# Patient Record
Sex: Male | Born: 1980 | Hispanic: Yes | Marital: Single | Smoking: Never smoker
Health system: Southern US, Community
[De-identification: ages and names within clinical notes are randomized; demographics above are authoritative.]

---

## 2018-07-27 ENCOUNTER — Emergency Department (HOSPITAL_COMMUNITY)
Admission: EM | Admit: 2018-07-27 | Discharge: 2018-07-28 | Disposition: A | Discharge status: Home / Self Care | Payer: Self-pay | Attending: Emergency Medicine | Admitting: Emergency Medicine

## 2018-07-27 ENCOUNTER — Other Ambulatory Visit: Payer: Self-pay

## 2018-07-27 ENCOUNTER — Encounter (HOSPITAL_COMMUNITY): Payer: Self-pay | Admitting: Emergency Medicine

## 2018-07-27 ENCOUNTER — Emergency Department (HOSPITAL_COMMUNITY): Payer: Self-pay

## 2018-07-27 DIAGNOSIS — S61011A Laceration without foreign body of right thumb without damage to nail, initial encounter: Secondary | ICD-10-CM | POA: Insufficient documentation

## 2018-07-27 DIAGNOSIS — Y9389 Activity, other specified: Secondary | ICD-10-CM | POA: Insufficient documentation

## 2018-07-27 DIAGNOSIS — Y990 Civilian activity done for income or pay: Secondary | ICD-10-CM | POA: Insufficient documentation

## 2018-07-27 DIAGNOSIS — Y929 Unspecified place or not applicable: Secondary | ICD-10-CM | POA: Insufficient documentation

## 2018-07-27 DIAGNOSIS — Z23 Encounter for immunization: Secondary | ICD-10-CM | POA: Insufficient documentation

## 2018-07-27 DIAGNOSIS — W260XXA Contact with knife, initial encounter: Secondary | ICD-10-CM | POA: Insufficient documentation

## 2018-07-27 MED ORDER — TETANUS-DIPHTH-ACELL PERTUSSIS 5-2.5-18.5 LF-MCG/0.5 IM SUSP
0.5000 mL | Freq: Once | INTRAMUSCULAR | Status: AC
  Administered 2018-07-27: 0.5 mL via INTRAMUSCULAR

## 2018-07-27 MED ORDER — TETANUS-DIPHTH-ACELL PERTUSSIS 5-2.5-18.5 LF-MCG/0.5 IM SUSP
INTRAMUSCULAR | Status: AC
  Administered 2018-07-27: 0.5 mL via INTRAMUSCULAR
  Filled 2018-07-27: qty 0.5

## 2018-07-27 NOTE — ED Triage Notes (Signed)
Pt states he cut right thumb with carpet blade at 11am today.

## 2018-07-27 NOTE — ED Notes (Signed)
Non stick telfa applied.

## 2018-07-28 NOTE — ED Provider Notes (Addendum)
[facility] EMERGENCY DEPARTMENT Provider Note   CSN: [id number] Arrival date & time: 07/27/18  2247  Time seen 11:42 PM   History   Chief Complaint Chief Complaint  Patient presents with  . Laceration    HPI Robert Malone is a 38 y.o. male.  HPI   patient is employed Physiological scientist and flooring.  He is right-handed.  Today he was using his left hand and he accidentally cut his right palm with a carpet blade.  He states it has been persistently bleeding which prompted his ED visit tonight.  He states his last tetanus was when he was around 38 years old.  PCP Patient, No Pcp Per  History reviewed. No pertinent past medical history.  There are no active problems to display for this patient.   History reviewed. No pertinent surgical history.      Home Medications    Prior to Admission medications   Not on File    Family History No family history on file.  Social History Social History   Tobacco Use  . Smoking status: Never Smoker  . Smokeless tobacco: Never Used  Substance Use Topics  . Alcohol use: Yes  . Drug use: Never   employed  Allergies   Patient has no allergy information on record.   Review of Systems Review of Systems  All other systems reviewed and are negative.    Physical Exam Updated Vital Signs BP (!) 189/84 (BP Location: Right Arm)   Pulse 61   Temp 98.1 F (36.7 C) (Oral)   Resp 17   Ht 5\' 1"  (1.549 m)   Wt 69.4 kg   SpO2 100%   BMI 28.91 kg/m   Physical Exam Vitals signs and nursing note reviewed.  Constitutional:      Appearance: Normal appearance.  HENT:     Head: Normocephalic and atraumatic.     Nose: Nose normal.  Eyes:     Extraocular Movements: Extraocular movements intact.     Conjunctiva/sclera: Conjunctivae normal.  Neck:     Musculoskeletal: Normal range of motion.  Cardiovascular:     Rate and Rhythm: Normal rate.  Pulmonary:     Effort: Pulmonary effort is normal. No respiratory distress.    Musculoskeletal: Normal range of motion.        General: Signs of injury present.     Comments: Patient has a linear 1.3 cm a avulsion of the end of his right thumb on the volar aspect.  Currently it is not bleeding but the dressing had dried where it had been bleeding.  Skin:    General: Skin is warm and dry.     Capillary Refill: Capillary refill takes less than 2 seconds.  Neurological:     General: No focal deficit present.     Mental Status: He is alert and oriented to person, place, and time.  Psychiatric:        Mood and Affect: Mood normal.        Behavior: Behavior normal.        Thought Content: Thought content normal.        ED Treatments / Results  Labs (all labs ordered are listed, but only abnormal results are displayed) Labs Reviewed - No data to display  EKG None  Radiology Dg Finger Thumb Right  Result Date: 07/28/2018 CLINICAL DATA:  Laceration EXAM: RIGHT THUMB 2+V COMPARISON:  None. FINDINGS: There is no evidence of fracture or dislocation. There is no evidence of arthropathy or other  focal bone abnormality. Laceration to the tip of the thumb. No radiopaque foreign body IMPRESSION: No acute osseous abnormality Electronically Signed   By: Jasmine PangKim  Fujinaga M.D.   On: 07/28/2018 00:31     Procedures Procedures (including critical care time)  Medications Ordered in ED Medications  Tdap (BOOSTRIX) injection 0.5 mL (0.5 mLs Intramuscular Given 07/27/18 2357)     Initial Impression / Assessment and Plan / ED Course  I have reviewed the triage vital signs and the nursing notes.  Pertinent labs & imaging results that were available during my care of the patient were reviewed by me and considered in my medical decision making (see chart for details).    Surgicel drip was placed on the avulses laceration with a non-sticky dressing and then wrapped.  We discussed his aftercare.  His tetanus was updated with a Boostrix.  Final Clinical Impressions(s) / ED  Diagnoses   Final diagnoses:  Laceration of right thumb without foreign body without damage to nail, initial encounter    ED Discharge Orders    None     Plan discharge  Devoria AlbeIva Velita Quirk, MD, Concha PyoFACEP    Shayona Hibbitts, MD 07/28/18 Lance Coon0011    Devoria AlbeKnapp, Keyry Iracheta, MD 07/28/18 [phone]

## 2018-07-28 NOTE — Discharge Instructions (Addendum)
Keep the dressing in place for the next 4 to 5 days and then remove it, it should slowly heal.  Return to the ED for any signs of infection.  Take Tylenol 650 mg every 6 hours if needed for discomfort.   Mantenga el vendaje en su lugar durante los prximos 4 a 5 das y luego qutelo, Energy managerdebe sanar lentamente. Regrese al servicio de urgencias para detectar cualquier signo de infeccin. Tome Tylenol 650 mg cada 6 horas si es necesario por molestias.

## 2020-03-25 IMAGING — DX DG FINGER THUMB 2+V*R*
3 series · 3 of 3 positions shown · non-contrast
Comparison: None.

CLINICAL DATA: Laceration

EXAM:
RIGHT THUMB 2+V

[finger ap]
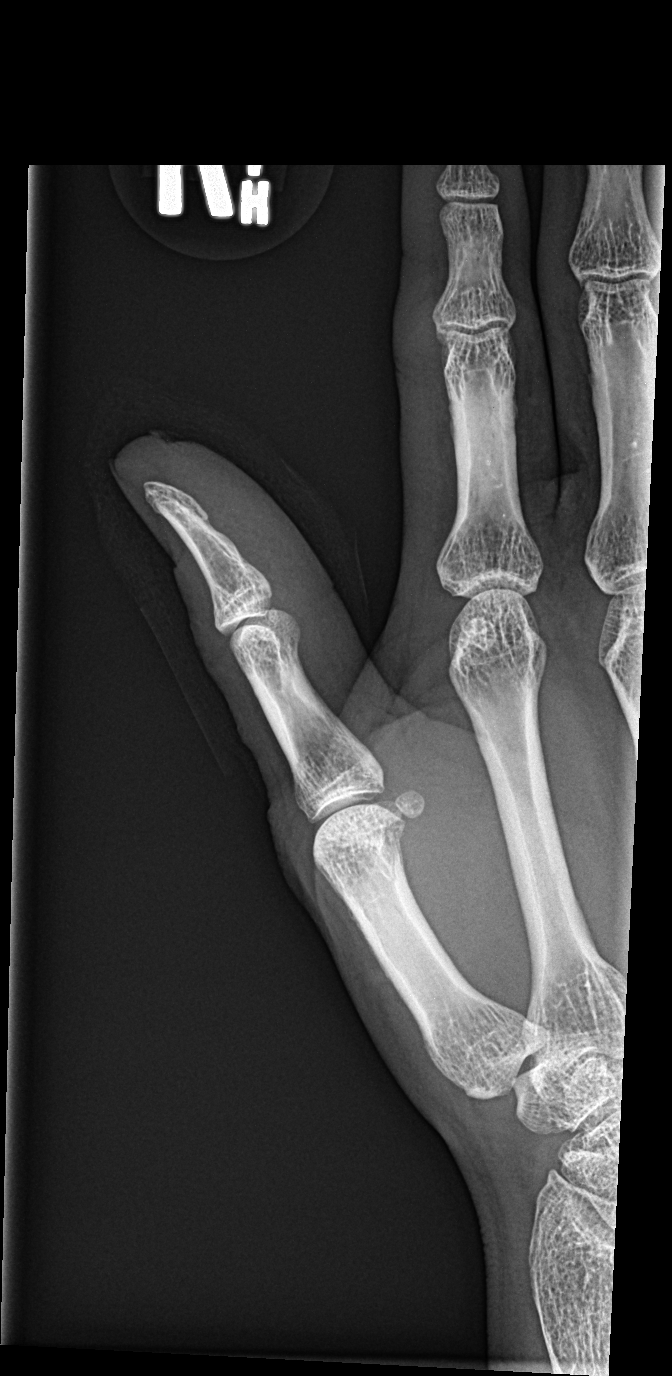

[finger obl]
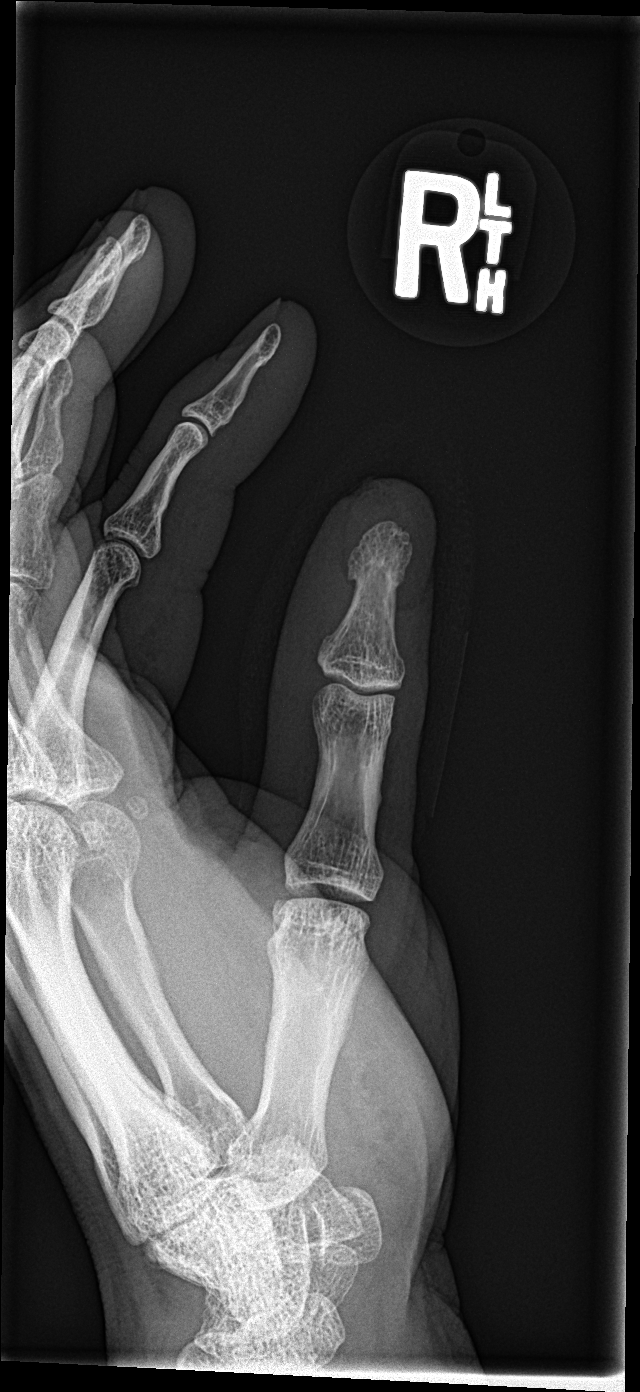

[finger lat]
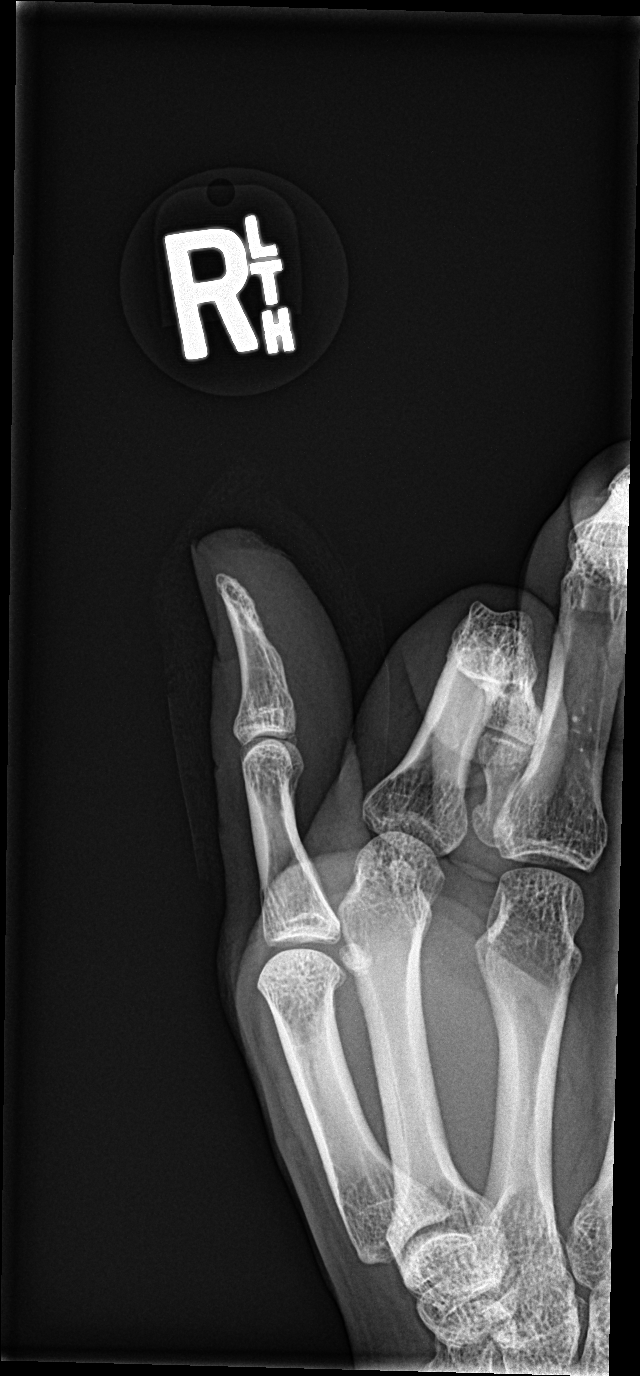

[3 of 3 positions shown; findings below may reference images not displayed]

FINDINGS: There is no evidence of fracture or dislocation. There is no
evidence of arthropathy or other focal bone abnormality. Laceration
to the tip of the thumb. No radiopaque foreign body
IMPRESSION: No acute osseous abnormality
